# Patient Record
Sex: Female | Born: 1965 | Race: White | Hispanic: No | Marital: Married | Smoking: Never smoker
Health system: Southern US, Community
[De-identification: ages and names within clinical notes are randomized; demographics above are authoritative.]

---

## 2015-07-13 ENCOUNTER — Ambulatory Visit
Admission: EM | Admit: 2015-07-13 | Discharge: 2015-07-13 | Disposition: A | Discharge status: Home / Self Care | Payer: Worker's Compensation | Attending: Family Medicine | Admitting: Family Medicine

## 2015-07-13 ENCOUNTER — Encounter: Payer: Self-pay | Admitting: Emergency Medicine

## 2015-07-13 ENCOUNTER — Ambulatory Visit: Payer: Worker's Compensation

## 2015-07-13 DIAGNOSIS — S93401A Sprain of unspecified ligament of right ankle, initial encounter: Secondary | ICD-10-CM

## 2015-07-13 NOTE — Discharge Instructions (Signed)
Use IBUPROFEN as directed.  Take with food. Return here or to PCP if severe pain, numbness, weakness or changes in movement. ICE/Elevate for at least 4 times daily, every 2 hours while at work Use ACE wrap on ankle Ankle Sprain An ankle sprain is an injury to the strong, fibrous tissues (ligaments) that hold the bones of your ankle joint together.  CAUSES An ankle sprain is usually caused by a fall or by twisting your ankle. Ankle sprains most commonly occur when you step on the outer edge of your foot, and your ankle turns inward. People who participate in sports are more prone to these types of injuries.  SYMPTOMS   Pain in your ankle. The pain may be present at rest or only when you are trying to stand or walk.  Swelling.  Bruising. Bruising may develop immediately or within 1 to 2 days after your injury.  Difficulty standing or walking, particularly when turning corners or changing directions. DIAGNOSIS  Your caregiver will ask you details about your injury and perform a physical exam of your ankle to determine if you have an ankle sprain. During the physical exam, your caregiver will press on and apply pressure to specific areas of your foot and ankle. Your caregiver will try to move your ankle in certain ways. An X-ray exam may be done to be sure a bone was not broken or a ligament did not separate from one of the bones in your ankle (avulsion fracture).  TREATMENT  Certain types of braces can help stabilize your ankle. Your caregiver can make a recommendation for this. Your caregiver may recommend the use of medicine for pain. If your sprain is severe, your caregiver may refer you to a surgeon who helps to restore function to parts of your skeletal system (orthopedist) or a physical therapist. HOME CARE INSTRUCTIONS   Apply ice to your injury for 1-2 days or as directed by your caregiver. Applying ice helps to reduce inflammation and pain.  Put ice in a plastic bag.  Place  a towel between your skin and the bag.  Leave the ice on for 15-20 minutes at a time, every 2 hours while you are awake.  Only take over-the-counter or prescription medicines for pain, discomfort, or fever as directed by your caregiver.  Elevate your injured ankle above the level of your heart as much as possible for 2-3 days.  If your caregiver recommends crutches, use them as instructed. Gradually put weight on the affected ankle. Continue to use crutches or a cane until you can walk without feeling pain in your ankle.  If you have a plaster splint, wear the splint as directed by your caregiver. Do not rest it on anything harder than a pillow for the first 24 hours. Do not put weight on it. Do not get it wet. You may take it off to take a shower or bath.  You may have been given an elastic bandage to wear around your ankle to provide support. If the elastic bandage is too tight (you have numbness or tingling in your foot or your foot becomes cold and blue), adjust the bandage to make it comfortable.  If you have an air splint, you may blow more air into it or let air out to make it more comfortable. You may take your splint off at night and before taking a shower or bath. Wiggle your toes in the splint several times per day to decrease swelling. SEEK MEDICAL CARE IF:  You have rapidly increasing bruising or swelling.  Your toes feel extremely cold or you lose feeling in your foot.  Your pain is not relieved with medicine. SEEK IMMEDIATE MEDICAL CARE IF:  Your toes are numb or blue.  You have severe pain that is increasing. MAKE SURE YOU:   Understand these instructions.  Will watch your condition.  Will get help right away if you are not doing well or get worse. Document Released: 10/23/2005 Document Revised: 07/17/2012 Document Reviewed: 11/04/2011 [facility] Patient Information 2015 [place], [place]. This information is not intended to replace advice given to you by your health  care provider. Make sure you discuss any questions you have with your health care provider.

## 2015-07-13 NOTE — ED Provider Notes (Signed)
CSN: [id number]     Arrival date & time 07/13/15  1150 History   First MD Initiated Contact with Patient 07/13/15 1239     Chief Complaint  Patient presents with  . Foot Pain    right  . Worker's Comp Injury     HPI Sherry Mcknight is a pleasant 49 y.o. female who presents with right ankle/foot pain.  Injury occurred at work, [employer], when she inverted her right ankle while stepping on a garden hose. She has had increased swelling of her right ankle and right foot. She has tried ice and elevation. She's also tried ibuprofen 800 mg every 6 hours which does seem to help relieve the pain and swelling some. She has been working in bearing weight on her right foot since the incident. Incident occurred at 1330 on 9/1.   Pain & swelling are worse at lateral mid-foot.  Pain 5/10 now.  Pain increases with movement & weight bearing.  History reviewed. No pertinent past medical history. History reviewed. No pertinent past surgical history. History reviewed. No pertinent family history. Social History  Substance Use Topics  . Smoking status: Never Smoker   . Smokeless tobacco: Never Used  . Alcohol Use: No   OB History    No data available     Review of Systems  All other systems reviewed and are negative.   Allergies  Review of patient's allergies indicates no known allergies.  Home Medications   Prior to Admission medications   Not on File   Meds Ordered and Administered this Visit  Medications - No data to display  BP 110/70 mmHg  Pulse 70  Temp(Src) 96.6 F (35.9 C) (Tympanic)  Resp 16  Ht  (1.727 m)  Wt 155 lb (70.308 kg)  BMI 23.57 kg/m2  SpO2 100%  LMP 06/24/2015 (Approximate) No data found.   Physical Exam  Constitutional: She is oriented to person, place, and time. She appears well-developed and well-nourished. No distress.  HENT:  Head: Normocephalic and atraumatic.  Eyes: Conjunctivae are normal. No scleral icterus.  Neck: Normal range of motion.    Cardiovascular: Normal rate and regular rhythm.   Pulmonary/Chest: Effort normal and breath sounds normal. No respiratory distress.  Abdominal: Soft. Bowel sounds are normal. She exhibits no distension.  Musculoskeletal: Normal range of motion. She exhibits no edema.       Right foot: There is tenderness, bony tenderness, swelling and deformity. There is normal range of motion, normal capillary refill, no crepitus and no laceration.       Feet:  Neurological: She is alert and oriented to person, place, and time. No cranial nerve deficit.  Skin: Skin is warm and dry. No rash noted. No erythema.  Psychiatric: Her behavior is normal. Judgment normal.  Nursing note and vitals reviewed.   ED Course  Procedures   Imaging Review Dg Ankle Complete Right  07/13/2015   CLINICAL DATA:  Injured foot and ankle 6 days ago with persistent pain  EXAM: RIGHT ANKLE - COMPLETE 3+ VIEW  COMPARISON:  None.  FINDINGS: The ankle joint appears normal. Alignment is normal. No fracture is seen.  IMPRESSION: Negative.   Electronically Signed   By: Dwyane Dee M.D.   On: 07/13/2015 13:27   Dg Foot Complete Right  07/13/2015   CLINICAL DATA:  Injured foot 6 days ago turning ankle with pain  EXAM: RIGHT FOOT COMPLETE - 3+ VIEW  COMPARISON:  None.  FINDINGS: Tarsal-metatarsal alignment is normal. No fracture  is seen. Joint spaces appear normal.  IMPRESSION: Negative.   Electronically Signed   By: Dwyane Dee M.D.   On: 07/13/2015 13:26    MDM   1. Ankle sprain, right, initial encounter    No fracture.  Plan: 1. Test/x-ray results and diagnosis reviewed with patient 2. Ibuprofen 600mg  -800mg  with food TID PRN swelling/pain 3. Recommend supportive treatment with RICE, ACE applied right ankle 4. F/u in 1 week for recheck 5.  Worker's comp paperwork completed     Joselyn Arrow, NP 07/13/15 1343

## 2015-07-13 NOTE — ED Notes (Signed)
Patient c/o pain across the top of her right foot since Thursday.  Patient states that she twisted her ankle at work on Thursday.

## 2017-05-23 ENCOUNTER — Other Ambulatory Visit: Payer: Self-pay | Admitting: Family Medicine

## 2017-05-23 DIAGNOSIS — Z1231 Encounter for screening mammogram for malignant neoplasm of breast: Secondary | ICD-10-CM

## 2017-06-07 ENCOUNTER — Ambulatory Visit
Admission: RE | Admit: 2017-06-07 | Discharge: 2017-06-07 | Disposition: A | Discharge status: Home / Self Care | Payer: 59 | Source: Ambulatory Visit | Attending: Family Medicine | Admitting: Family Medicine

## 2017-06-07 ENCOUNTER — Encounter: Payer: Self-pay | Admitting: Radiology

## 2017-06-07 DIAGNOSIS — N6489 Other specified disorders of breast: Secondary | ICD-10-CM | POA: Diagnosis not present

## 2017-06-07 DIAGNOSIS — R921 Mammographic calcification found on diagnostic imaging of breast: Secondary | ICD-10-CM | POA: Diagnosis not present

## 2017-06-07 DIAGNOSIS — Z1231 Encounter for screening mammogram for malignant neoplasm of breast: Secondary | ICD-10-CM | POA: Diagnosis not present

## 2017-06-12 ENCOUNTER — Other Ambulatory Visit: Payer: Self-pay | Admitting: Family Medicine

## 2017-06-12 DIAGNOSIS — R928 Other abnormal and inconclusive findings on diagnostic imaging of breast: Secondary | ICD-10-CM

## 2017-06-25 ENCOUNTER — Ambulatory Visit
Admission: RE | Admit: 2017-06-25 | Discharge: 2017-06-25 | Disposition: A | Discharge status: Home / Self Care | Payer: 59 | Source: Ambulatory Visit | Attending: Family Medicine | Admitting: Family Medicine

## 2017-06-25 DIAGNOSIS — R928 Other abnormal and inconclusive findings on diagnostic imaging of breast: Secondary | ICD-10-CM | POA: Insufficient documentation

## 2017-08-29 IMAGING — MG MM DIGITAL DIAGNOSTIC UNILAT*L* W/ TOMO W/ CAD
8 of 11 series · 8 of 19 positions shown · non-contrast
Comparison: Previous exam(s).

CLINICAL DATA: Left breast asymmetry and a separate group of
calcifications seen on most recent baseline screening mammogram.

EXAM:
2D DIGITAL DIAGNOSTIC LEFT MAMMOGRAM WITH CAD AND ADJUNCT TOMO
ULTRASOUND LEFT BREAST

[L CC (1 of 2)]
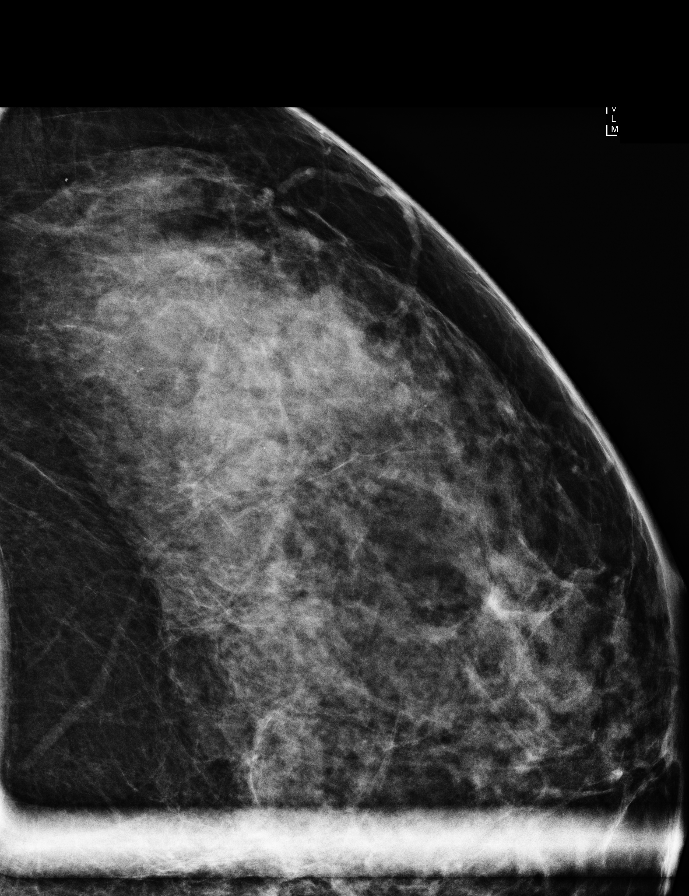

[L ML (1 of 2)]
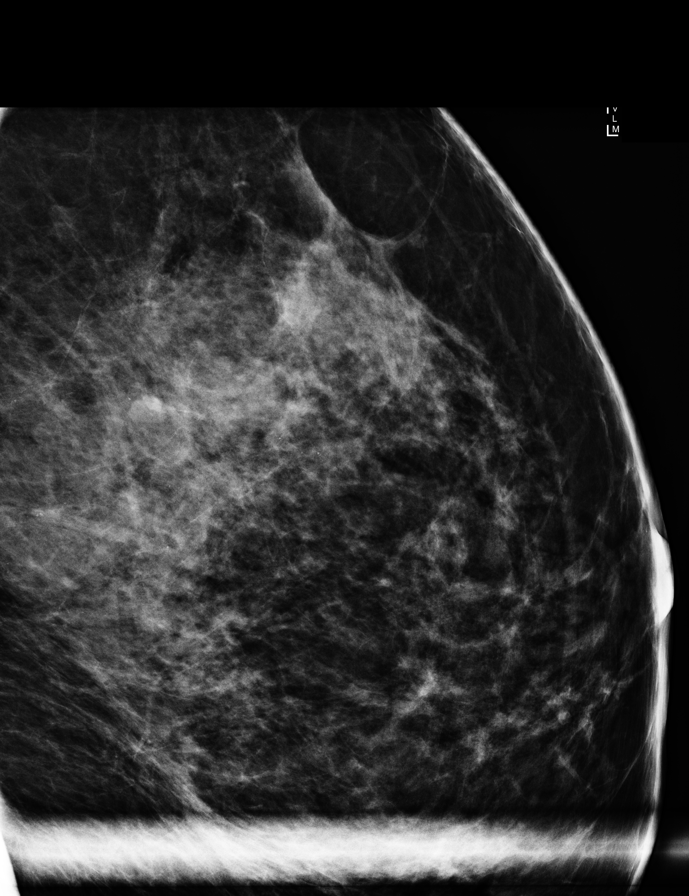

[L MLO (1 of 2)]
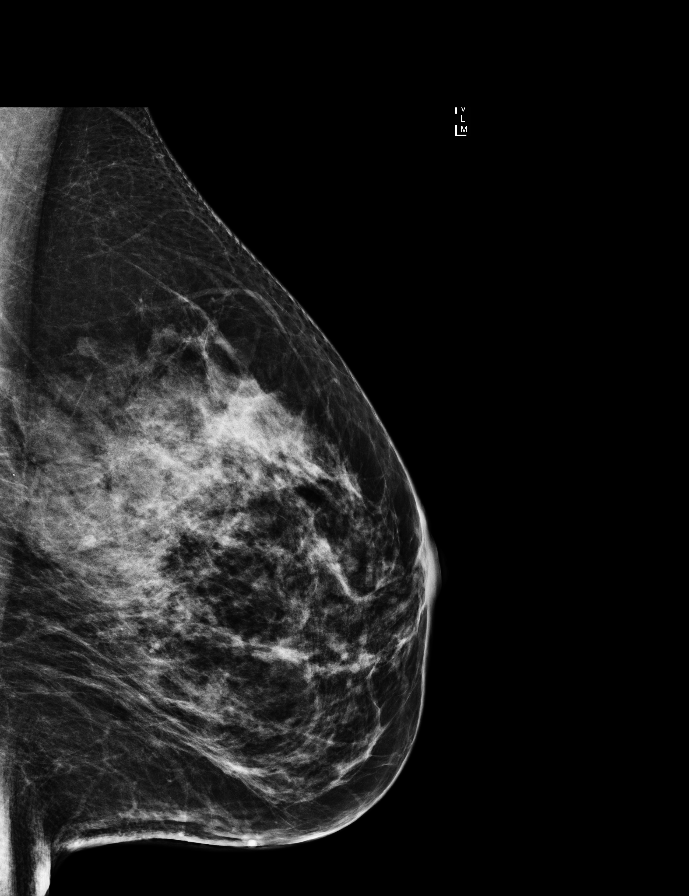

[L CC synth-2D]
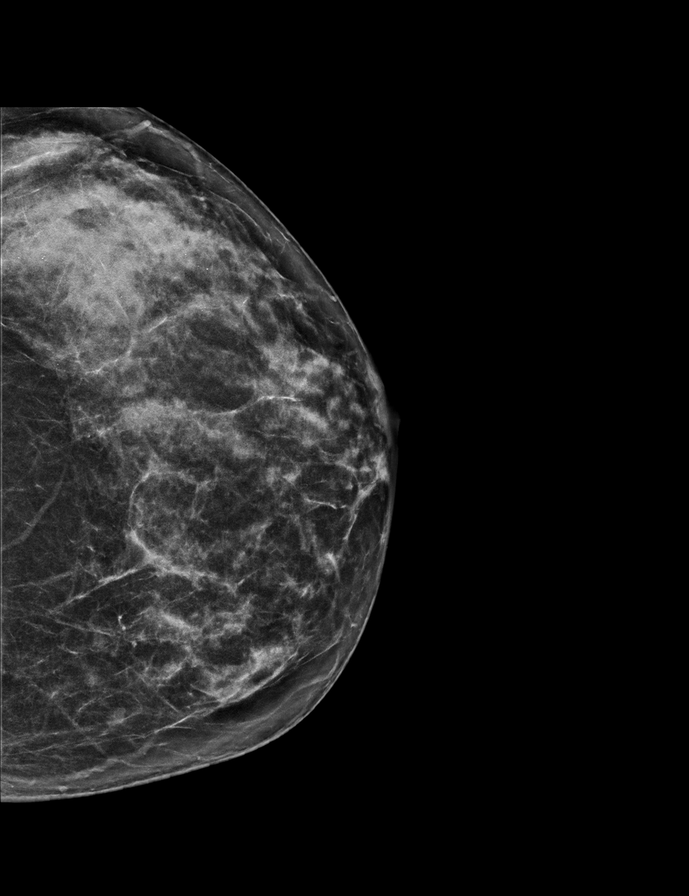

[L CC (2 of 2)]
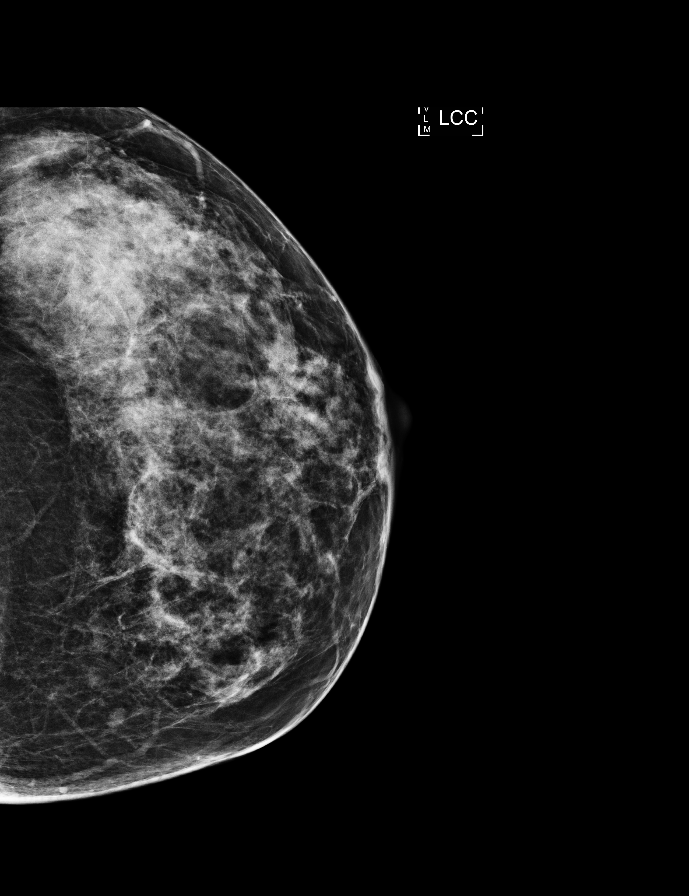

[L MLO synth-2D]
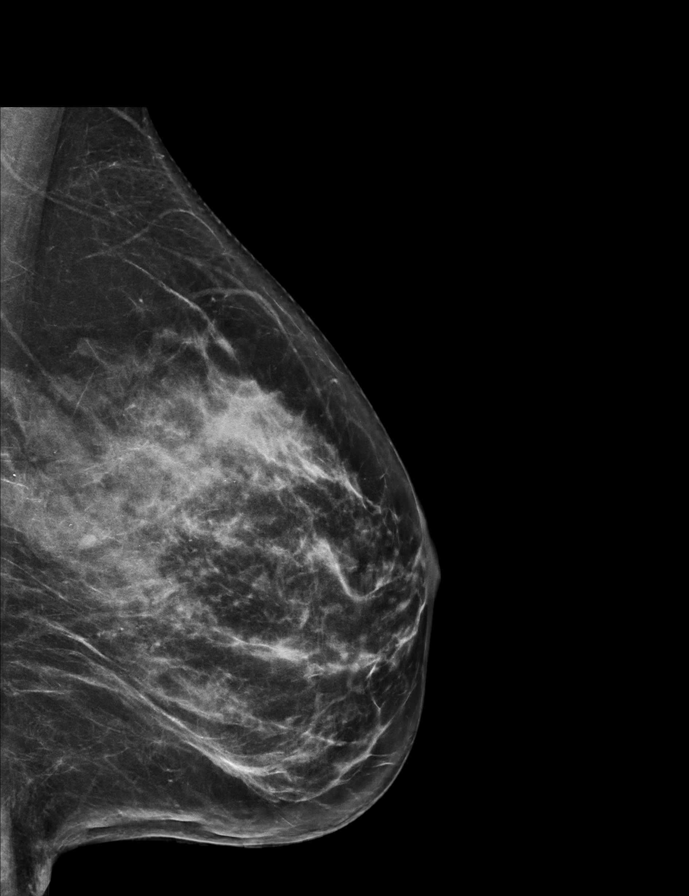

[L MLO (2 of 2)]
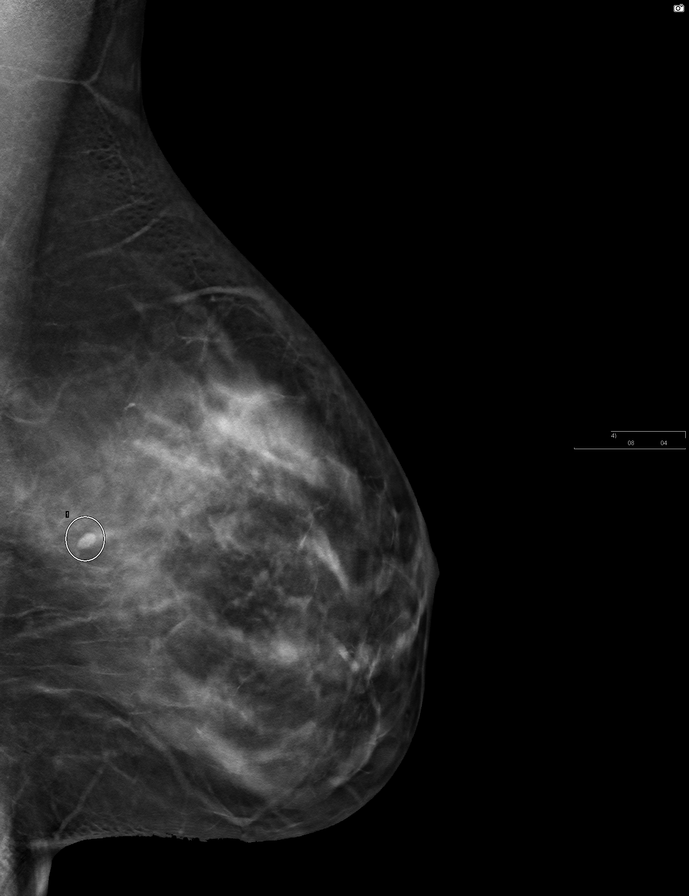

[L ML (2 of 2)]
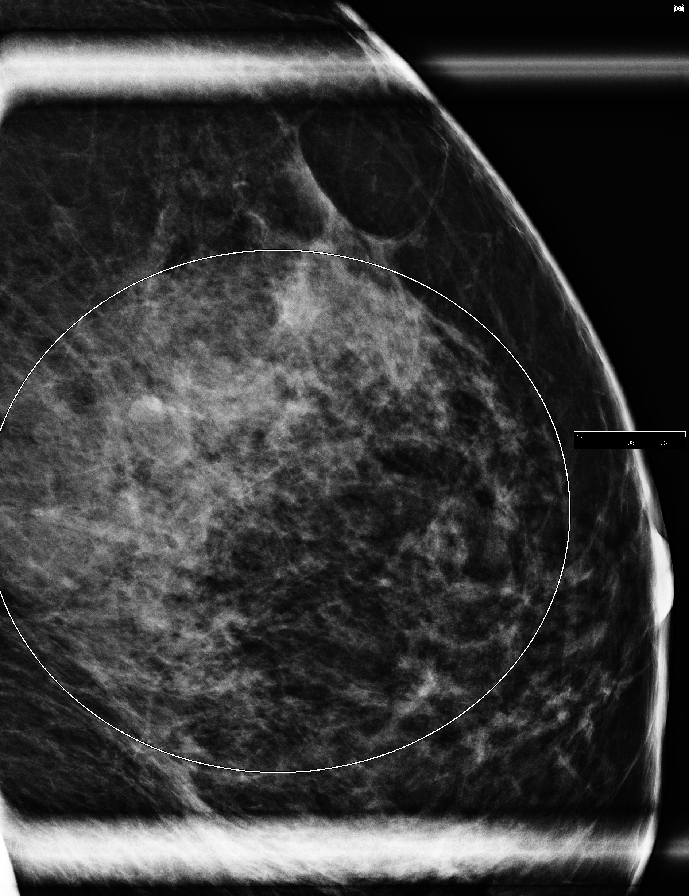

[8 of 19 positions shown; findings below may reference images not displayed]

ACR Breast Density Category c: The breast tissue is heterogeneously
dense, which may obscure small masses.
FINDINGS: Additional mammographic views of the left breast demonstrate loosely
scattered relatively coarse probably benign calcifications
throughout the upper outer quadrant of the left breast. No
suspicious clustering or linear forms are seen. There is also a
persistent 6 mm circumscribed benign-appearing nodule in the
slightly upper inner left breast, middle to posterior depth.

Mammographic images were processed with CAD.

On physical exam, no suspicious masses are palpated.

Targeted ultrasound is performed, showing no suspicious masses or
shadowing lesions. No mammogram correlation is seen to the 6 mm
mammographically seen nodule.
IMPRESSION: Left breast probably benign calcifications in the upper outer
quadrant.

Subcentimeter probably benign nodule in the medial left breast,
without sonographic correlation.

RECOMMENDATION:
Diagnostic mammogram and possibly ultrasound of the left breast in 6
months. (Code:[id number])

I have discussed the findings and recommendations with the patient.
Results were also provided in writing at the conclusion of the
visit. If applicable, a reminder letter will be sent to the patient
regarding the next appointment.

BI-RADS CATEGORY  3: Probably benign.
